# Patient Record
Sex: Female | Born: 1960 | Hispanic: No | Marital: Married
Health system: Southern US, Community
[De-identification: ages and names within clinical notes are randomized; demographics above are authoritative.]

---

## 2012-07-09 ENCOUNTER — Other Ambulatory Visit: Payer: Self-pay | Admitting: Gastroenterology

## 2012-07-09 DIAGNOSIS — Z1231 Encounter for screening mammogram for malignant neoplasm of breast: Secondary | ICD-10-CM

## 2012-07-09 DIAGNOSIS — Z780 Asymptomatic menopausal state: Secondary | ICD-10-CM

## 2012-10-09 ENCOUNTER — Other Ambulatory Visit: Payer: Self-pay | Admitting: Gastroenterology

## 2012-10-09 DIAGNOSIS — Z1239 Encounter for other screening for malignant neoplasm of breast: Secondary | ICD-10-CM

## 2012-10-24 ENCOUNTER — Ambulatory Visit: Payer: Self-pay

## 2012-10-24 ENCOUNTER — Other Ambulatory Visit: Payer: Self-pay

## 2012-11-04 ENCOUNTER — Ambulatory Visit
Admission: RE | Admit: 2012-11-04 | Discharge: 2012-11-04 | Disposition: A | Discharge status: Home / Self Care | Payer: BC Managed Care – PPO | Source: Ambulatory Visit | Attending: Gastroenterology | Admitting: Gastroenterology

## 2012-11-04 DIAGNOSIS — Z1231 Encounter for screening mammogram for malignant neoplasm of breast: Secondary | ICD-10-CM

## 2012-11-04 DIAGNOSIS — Z780 Asymptomatic menopausal state: Secondary | ICD-10-CM

## 2014-04-17 IMAGING — MG MM SCREEN MAMMOGRAM BILATERAL
4 series · 4 of 4 positions shown · non-contrast
Comparison: Previous exam(s).

CLINICAL DATA: Screening.

DIGITAL SCREENING BILATERAL MAMMOGRAM WITH CAD

[R CC]
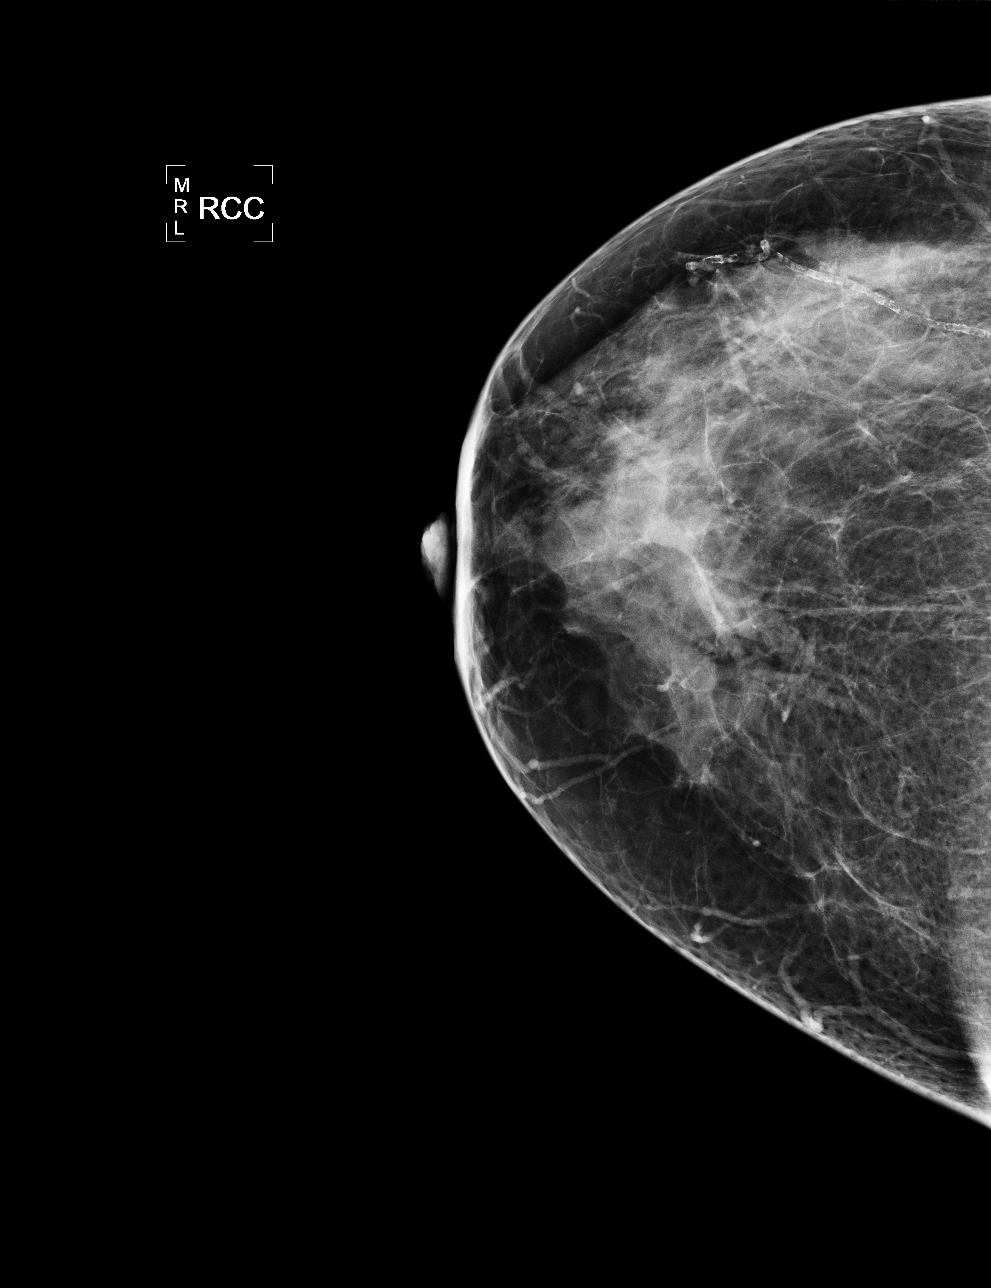

[L CC]
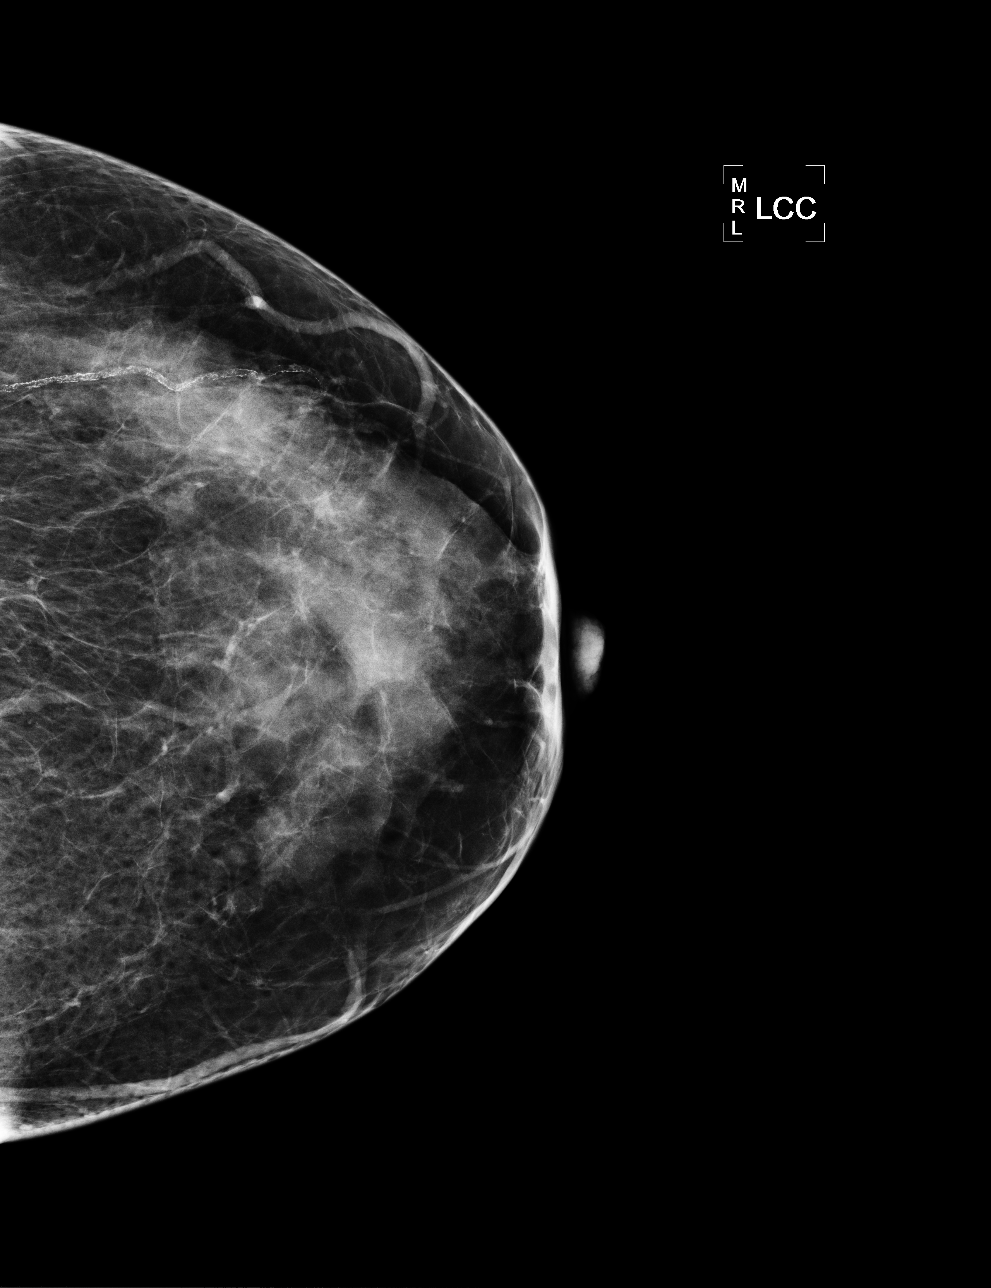

[L MLO]
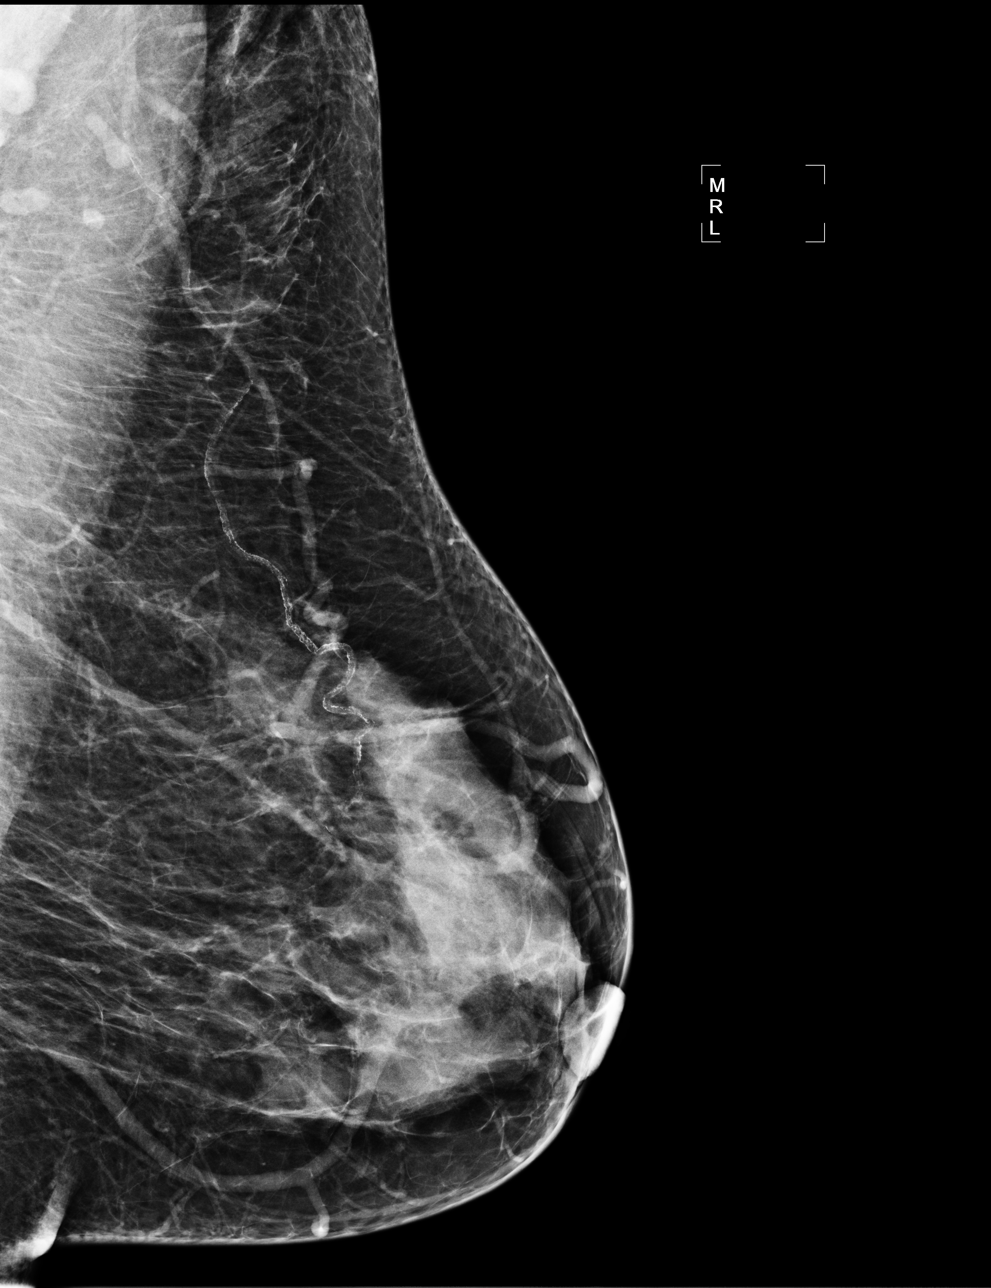

[R MLO]
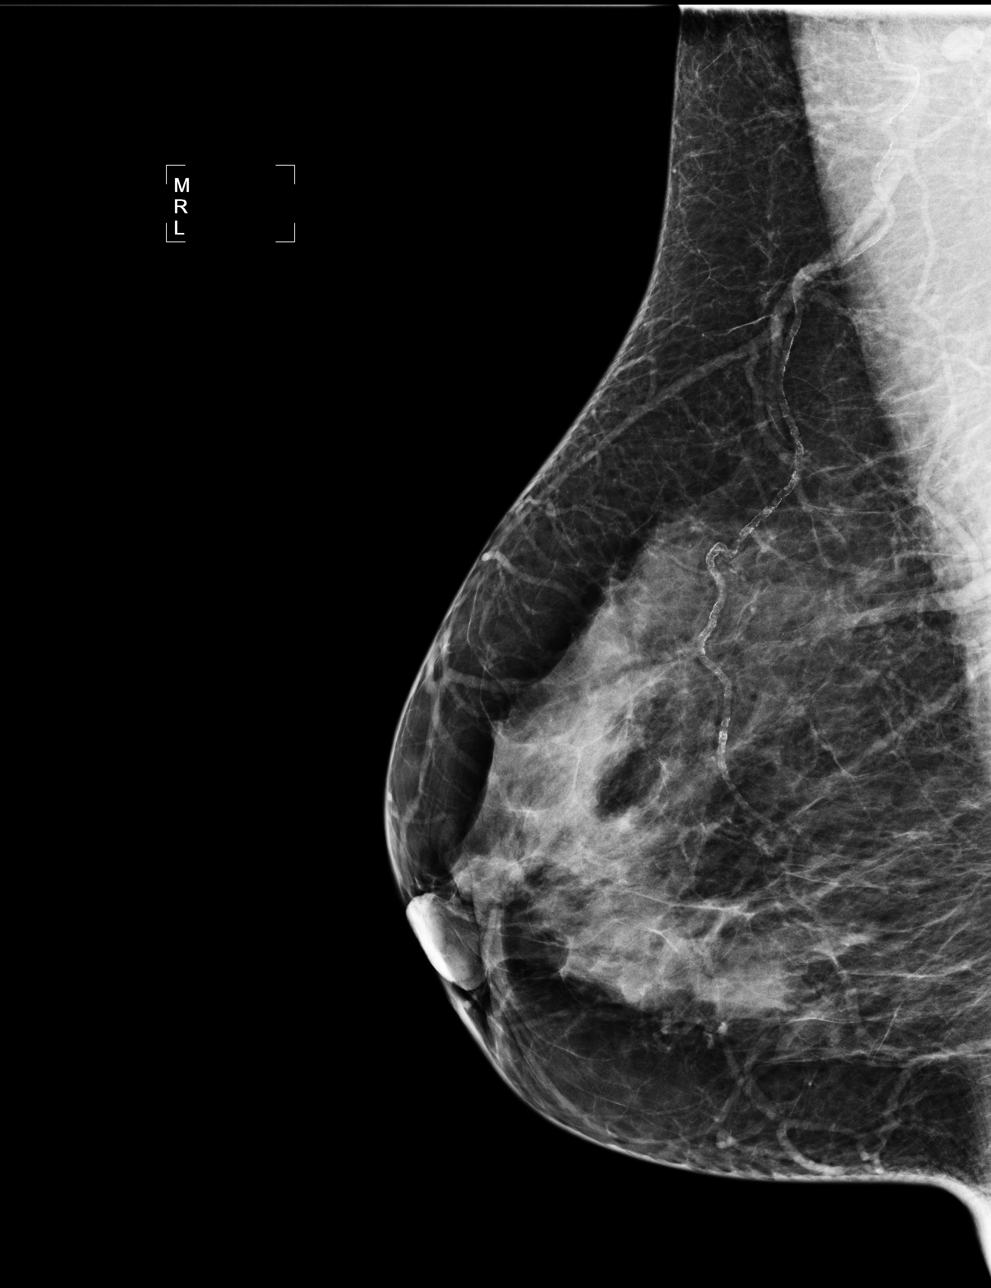

[4 of 4 positions shown; findings below may reference images not displayed]

FINDINGS: ACR Breast Density Category c:  The breast tissue is
heterogeneously dense, which may obscure small masses.

There are no findings suspicious for malignancy.

Images were processed with CAD.
IMPRESSION: No mammographic evidence of malignancy.

A result letter of this screening mammogram will be mailed directly
to the patient.

RECOMMENDATION:
Screening mammogram in one year. (Code:HG-[id number])

BI-RADS CATEGORY 1:  Negative.

## 2021-04-30 ENCOUNTER — Ambulatory Visit: Payer: Self-pay

## 2022-04-08 ENCOUNTER — Other Ambulatory Visit: Payer: Self-pay

## 2022-04-08 ENCOUNTER — Emergency Department (HOSPITAL_COMMUNITY)
Admission: EM | Admit: 2022-04-08 | Discharge: 2022-04-08 | Disposition: A | Discharge status: Home / Self Care | Payer: BLUE CROSS/BLUE SHIELD | Attending: Emergency Medicine | Admitting: Emergency Medicine

## 2022-04-08 ENCOUNTER — Emergency Department (HOSPITAL_COMMUNITY): Payer: BLUE CROSS/BLUE SHIELD

## 2022-04-08 DIAGNOSIS — R1013 Epigastric pain: Secondary | ICD-10-CM | POA: Insufficient documentation

## 2022-04-08 DIAGNOSIS — R1011 Right upper quadrant pain: Secondary | ICD-10-CM | POA: Diagnosis not present

## 2022-04-08 DIAGNOSIS — K8020 Calculus of gallbladder without cholecystitis without obstruction: Secondary | ICD-10-CM

## 2022-04-08 LAB — URINALYSIS, ROUTINE W REFLEX MICROSCOPIC
Bilirubin Urine: NEGATIVE
Glucose, UA: NEGATIVE mg/dL
Hgb urine dipstick: NEGATIVE
Ketones, ur: NEGATIVE mg/dL
Leukocytes,Ua: NEGATIVE
Nitrite: NEGATIVE
Protein, ur: 100 mg/dL — AB
Specific Gravity, Urine: 1.016 (ref 1.005–1.030)
pH: 7 (ref 5.0–8.0)

## 2022-04-08 LAB — CBC WITH DIFFERENTIAL/PLATELET
Abs Immature Granulocytes: 0.05 10*3/uL (ref 0.00–0.07)
Basophils Absolute: 0.1 10*3/uL (ref 0.0–0.1)
Basophils Relative: 1 %
Eosinophils Absolute: 0 10*3/uL (ref 0.0–0.5)
Eosinophils Relative: 0 %
HCT: 43.9 % (ref 36.0–46.0)
Hemoglobin: 14.5 g/dL (ref 12.0–15.0)
Immature Granulocytes: 1 %
Lymphocytes Relative: 9 %
Lymphs Abs: 1 10*3/uL (ref 0.7–4.0)
MCH: 31.2 pg (ref 26.0–34.0)
MCHC: 33 g/dL (ref 30.0–36.0)
MCV: 94.4 fL (ref 80.0–100.0)
Monocytes Absolute: 0.3 10*3/uL (ref 0.1–1.0)
Monocytes Relative: 3 %
Neutro Abs: 9.3 10*3/uL — ABNORMAL HIGH (ref 1.7–7.7)
Neutrophils Relative %: 86 %
Platelets: 329 10*3/uL (ref 150–400)
RBC: 4.65 MIL/uL (ref 3.87–5.11)
RDW: 12.9 % (ref 11.5–15.5)
WBC: 10.7 10*3/uL — ABNORMAL HIGH (ref 4.0–10.5)
nRBC: 0 % (ref 0.0–0.2)

## 2022-04-08 LAB — COMPREHENSIVE METABOLIC PANEL
ALT: 29 U/L (ref 0–44)
AST: 34 U/L (ref 15–41)
Albumin: 4.4 g/dL (ref 3.5–5.0)
Alkaline Phosphatase: 60 U/L (ref 38–126)
Anion gap: 10 (ref 5–15)
BUN: 10 mg/dL (ref 6–20)
CO2: 25 mmol/L (ref 22–32)
Calcium: 8.9 mg/dL (ref 8.9–10.3)
Chloride: 98 mmol/L (ref 98–111)
Creatinine, Ser: 0.55 mg/dL (ref 0.44–1.00)
GFR, Estimated: >60 mL/min (ref >60)
Glucose, Bld: 129 mg/dL — ABNORMAL HIGH (ref 70–99)
Potassium: 3.7 mmol/L (ref 3.5–5.1)
Sodium: 133 mmol/L — ABNORMAL LOW (ref 135–145)
Total Bilirubin: 0.5 mg/dL (ref 0.3–1.2)
Total Protein: 8.7 g/dL — ABNORMAL HIGH (ref 6.5–8.1)

## 2022-04-08 LAB — TROPONIN I (HIGH SENSITIVITY): Troponin I (High Sensitivity): 3 ng/L (ref <18)

## 2022-04-08 LAB — LIPASE, BLOOD: Lipase: 38 U/L (ref 11–51)

## 2022-04-08 MED ORDER — SODIUM CHLORIDE 0.9 % IV BOLUS
1000.0000 mL | Freq: Once | INTRAVENOUS | Status: AC
  Administered 2022-04-08: 1000 mL via INTRAVENOUS

## 2022-04-08 MED ORDER — OXYCODONE-ACETAMINOPHEN 5-325 MG PO TABS: 1.0000 | ORAL_TABLET | Freq: Four times a day (QID) | ORAL | 0 refills | Status: AC | PRN

## 2022-04-08 MED ORDER — FAMOTIDINE IN NACL 20-0.9 MG/50ML-% IV SOLN: 20.0000 mg | Freq: Once | INTRAVENOUS | Status: DC

## 2022-04-08 MED ORDER — ONDANSETRON HCL 4 MG/2ML IJ SOLN
4.0000 mg | Freq: Once | INTRAMUSCULAR | Status: AC
  Administered 2022-04-08: 4 mg via INTRAVENOUS
  Filled 2022-04-08: qty 2

## 2022-04-08 MED ORDER — MORPHINE SULFATE (PF) 4 MG/ML IV SOLN
4.0000 mg | Freq: Once | INTRAVENOUS | Status: AC
  Administered 2022-04-08: 4 mg via INTRAVENOUS
  Filled 2022-04-08: qty 1

## 2022-04-08 MED ORDER — ONDANSETRON 4 MG PO TBDP: 4.0000 mg | ORAL_TABLET | Freq: Three times a day (TID) | ORAL | 0 refills | Status: AC | PRN

## 2022-04-08 NOTE — ED Provider Triage Note (Signed)
Emergency Medicine Provider Triage Evaluation Note  Shannon Johnson , a 62 y.o. female  was evaluated in triage.  Pt complains of gastric pain since 7 AM not improved with antacids.  Patient also has some pain in the right upper shoulder blade area.  She has had vomiting x 2.  No sweating or dizziness or shortness of breath or chest pain..  Review of Systems  Positive: Abdominal pain, vomiting Negative: Chest pain  Physical Exam  There were no vitals taken for this visit. Gen:   Awake, no distress   Resp:  Normal effort  MSK:   Moves extremities without difficulty  Other:  Is soft with mild epigastric tenderness, moderate right upper quadrant tenderness  Medical Decision Making  Medically screening exam initiated at 1:45 PM.  Appropriate orders placed.  Aydan Phoenix was informed that the remainder of the evaluation will be completed by another provider, this initial triage assessment does not replace that evaluation, and the importance of remaining in the ED until their evaluation is complete.     Sherrye Payor A, [place] 04/08/22 1351

## 2022-04-08 NOTE — ED Triage Notes (Signed)
Upon waking abd pain with n/v x2 episodes.  Pain worse with palpitation.  Denies cp/sob Denies diarrhea

## 2022-04-08 NOTE — Discharge Instructions (Addendum)
Please take your medications as prescribed. Take tylenol/ibuprofen or percocet as needed for pain. Take zofran for nausea. I recommend close follow-up with general surgery for reevaluation.  Please do not hesitate to return to emergency department if worrisome signs symptoms we discussed become apparent such as fever, intractable abdominal pain.

## 2022-04-08 NOTE — ED Provider Notes (Signed)
[facility] Provider Note   CSN: [id number] Arrival date & time: 04/08/22  1335     History  Chief Complaint  Patient presents with   Abdominal Pain    Shannon Johnson is a 62 y.o. female with a past medical history of hypertension presents today for evaluation of abdominal pain.  Patient reports she has had upper abdominal pain since this morning after waking up.  She described the pain as constant, dull, located on the epigastric area and right upper quadrant, nonradiating.  Patient has tried Prilosec at home with no relief.  She reports 2 episodes of nbnb vomiting.  She denies fever, chest pain, shortness of breath, bowel changes, urinary symptoms, blood in stool or urine.   Abdominal Pain   No past medical history on file.   Home Medications Prior to Admission medications   Medication Sig Start Date End Date Taking? Authorizing Provider  ondansetron (ZOFRAN-ODT) 4 MG disintegrating tablet Take 1 tablet (4 mg total) by mouth every 8 (eight) hours as needed for nausea or vomiting. 04/08/22  Yes Rex Kras, PA  oxyCODONE-acetaminophen (PERCOCET/ROXICET) 5-325 MG tablet Take 1 tablet by mouth every 6 (six) hours as needed for severe pain. 04/08/22  Yes Rex Kras, PA      Allergies    Patient has no known allergies.    Review of Systems   Review of Systems  Gastrointestinal:  Positive for abdominal pain.    Physical Exam Updated Vital Signs BP (!) 118/90   Pulse 95   Temp 98 F (36.7 C)   Resp 16   Wt 68.9 kg   SpO2 98%  Physical Exam Vitals and nursing note reviewed.  Constitutional:      Appearance: Normal appearance.  HENT:     Head: Normocephalic and atraumatic.     Mouth/Throat:     Mouth: Mucous membranes are moist.  Eyes:     General: No scleral icterus. Cardiovascular:     Rate and Rhythm: Normal rate and regular rhythm.     Pulses: Normal pulses.     Heart sounds: Normal heart sounds.  Pulmonary:     Effort:  Pulmonary effort is normal.     Breath sounds: Normal breath sounds.  Abdominal:     General: Abdomen is flat.     Palpations: Abdomen is soft.     Tenderness: There is abdominal tenderness in the right upper quadrant and epigastric area.  Musculoskeletal:        General: No deformity.  Skin:    General: Skin is warm.     Findings: No rash.  Neurological:     General: No focal deficit present.     Mental Status: She is alert.  Psychiatric:        Mood and Affect: Mood normal.     ED Results / Procedures / Treatments   Labs (all labs ordered are listed, but only abnormal results are displayed) Labs Reviewed  CBC WITH DIFFERENTIAL/PLATELET - Abnormal; Notable for the following components:      Result Value   WBC 10.7 (*)    Neutro Abs 9.3 (*)    All other components within normal limits  COMPREHENSIVE METABOLIC PANEL - Abnormal; Notable for the following components:   Sodium 133 (*)    Glucose, Bld 129 (*)    Total Protein 8.7 (*)    All other components within normal limits  URINALYSIS, ROUTINE W REFLEX MICROSCOPIC - Abnormal; Notable for the following  components:   Protein, ur 100 (*)    Bacteria, UA RARE (*)    All other components within normal limits  LIPASE, BLOOD  TROPONIN I (HIGH SENSITIVITY)    EKG EKG Interpretation  Date/Time:  Sunday April 08 2022 13:59:09 EST Ventricular Rate:  108 PR Interval:  177 QRS Duration: 86 QT Interval:  373 QTC Calculation: 500 R Axis:   28 Text Interpretation: Sinus tachycardia Consider left atrial enlargement Inferior infarct, age indeterminate No old tracing to compare Confirmed by Isla Pence [phone]) on 04/08/2022 3:00:24 PM  Radiology US Abdomen Limited RUQ (LIVER/GB)  Result Date: 04/08/2022 CLINICAL DATA:  Right upper quadrant pain. EXAM: ULTRASOUND ABDOMEN LIMITED RIGHT UPPER QUADRANT COMPARISON:  None Available. FINDINGS: Gallbladder: Cholelithiasis with several stones measuring up to 1.1 cm diameter. Mild  gallbladder wall thickening at 4 mm with mild pericholecystic edema. Murphy's sign is negative. Common bile duct: Diameter: 4 mm, normal Liver: No focal lesion identified. Within normal limits in parenchymal echogenicity. Portal vein is patent on color Doppler imaging with normal direction of blood flow towards the liver. Other: None. IMPRESSION: Cholelithiasis with gallbladder wall thickening and pericholecystic edema. Although Murphy's sign is negative, changes could indicate acute cholecystitis in the appropriate clinical setting. Electronically Signed   By: Lucienne Capers M.D.   On: 04/08/2022 17:55    Procedures Procedures    Medications Ordered in ED Medications  sodium chloride 0.9 % bolus 1,000 mL (0 mLs Intravenous Stopped 04/08/22 1723)  ondansetron (ZOFRAN) injection 4 mg (4 mg Intravenous Given 04/08/22 1629)  morphine (PF) 4 MG/ML injection 4 mg (4 mg Intravenous Given 04/08/22 1629)    ED Course/ Medical Decision Making/ A&P                             Medical Decision Making Amount and/or Complexity of Data Reviewed Radiology: ordered.  Risk Prescription drug management.   This patient presents to the ED for upper abdominal pain, this involves an extensive number of treatment options, and is a complaint that carries with a high risk of complications and morbidity.  The differential diagnosis includes aortic dissection, ACS/MI, enteritis, cholecystitis, GERD, pancreatitis, pneumonia, PUD.  This is not an exhaustive list.  Lab tests: I ordered and personally interpreted labs.  The pertinent results include: WBC unremarkable. Hbg unremarkable. Platelets unremarkable. No electrolyte abnormalities noted. BUN, creatinine unremarkable. UA significant for no acute abnormality. Trop unremarkable.  Imaging studies: I ordered imaging studies. I personally reviewed, interpreted imaging and agree with the radiologist's interpretations. The results include: [place] RUQ   Problem list/ ED  course/ Critical interventions/ Medical management: HPI: See above Vital signs within normal range and stable throughout visit. Laboratory/imaging studies significant for: See above. On physical examination, patient is afebrile and appears in no acute distress. Abdominal exam without peritoneal signs. No evidence of acute abdomen at this time. Well appearing. Given work up, can not rule out acute hepatobiliary disease (including acute cholecystitis or cholangitis), acute pancreatitis (neg lipase), PUD (including gastric perforation), acute infectious processes (pneumonia, hepatitis, pyelonephritis), acute appendicitis, vascular catastrophe, bowel obstruction, viscus perforation, or testicular torsion, diverticulitis. Presentation not consistent with other acute, emergent causes of abdominal pain at this time. Based on patient's clinical presentations and laboratory/imaging studies I suspect cholelithiasis borderline cholecystitis. Reevaluation of the patient after these medications showed that the patient improved. Advised patient to take Tylenol/ibuprofen/naproxen or percocet for pain, follow-up with general surgery for further evaluation and management,  return to the ER if new or worsening symptoms such as fever or intractable nausea, vomiting.  I have reviewed the patient home medicines and have made adjustments as needed.  Cardiac monitoring/EKG: The patient was maintained on a cardiac monitor.  I personally reviewed and interpreted the cardiac monitor which showed an underlying rhythm of: sinus rhythm.  Additional history obtained: External records from outside source obtained and reviewed including: Chart review including previous notes, labs, imaging.  Disposition Continued outpatient therapy. Follow-up with general surgery recommended for reevaluation of symptoms. Treatment plan discussed with patient.  Pt acknowledged understanding was agreeable to the plan. Worrisome signs and symptoms were  discussed with patient, and patient acknowledged understanding to return to the ED if they noticed these signs and symptoms. Patient was stable upon discharge.   This chart was dictated using voice recognition software.  Despite best efforts to proofread,  errors can occur which can change the documentation meaning.          Final Clinical Impression(s) / ED Diagnoses Final diagnoses:  Epigastric pain    Rx / DC Orders ED Discharge Orders          Ordered    oxyCODONE-acetaminophen (PERCOCET/ROXICET) 5-325 MG tablet  Every 6 hours PRN        04/08/22 1818    ondansetron (ZOFRAN-ODT) 4 MG disintegrating tablet  Every 8 hours PRN        04/08/22 1818              Rex Kras, PA 04/08/22 2100    Isla Pence, MD 04/08/22 2107

## 2022-04-23 ENCOUNTER — Other Ambulatory Visit: Payer: Self-pay | Admitting: Surgery
